# Patient Record
Sex: Female | Born: 1950 | Race: Black or African American | Hispanic: No | Marital: Married | Smoking: Former smoker
Health system: Southern US, Community
[De-identification: ages and names within clinical notes are randomized; demographics above are authoritative.]

## PROBLEM LIST (undated history)

## (undated) DIAGNOSIS — R0601 Orthopnea: Secondary | ICD-10-CM

## (undated) DIAGNOSIS — K449 Diaphragmatic hernia without obstruction or gangrene: Secondary | ICD-10-CM

## (undated) DIAGNOSIS — R0600 Dyspnea, unspecified: Secondary | ICD-10-CM

## (undated) DIAGNOSIS — I10 Essential (primary) hypertension: Secondary | ICD-10-CM

## (undated) DIAGNOSIS — J310 Chronic rhinitis: Secondary | ICD-10-CM

## (undated) DIAGNOSIS — M1990 Unspecified osteoarthritis, unspecified site: Secondary | ICD-10-CM

## (undated) DIAGNOSIS — Z87898 Personal history of other specified conditions: Secondary | ICD-10-CM

## (undated) DIAGNOSIS — J45909 Unspecified asthma, uncomplicated: Secondary | ICD-10-CM

## (undated) DIAGNOSIS — I499 Cardiac arrhythmia, unspecified: Secondary | ICD-10-CM

## (undated) DIAGNOSIS — R519 Headache, unspecified: Secondary | ICD-10-CM

## (undated) DIAGNOSIS — G4730 Sleep apnea, unspecified: Secondary | ICD-10-CM

## (undated) DIAGNOSIS — K219 Gastro-esophageal reflux disease without esophagitis: Secondary | ICD-10-CM

## (undated) DIAGNOSIS — R51 Headache: Secondary | ICD-10-CM

## (undated) DIAGNOSIS — E785 Hyperlipidemia, unspecified: Secondary | ICD-10-CM

## (undated) HISTORY — PX: UPPER GI ENDOSCOPY: SHX6162

## (undated) HISTORY — PX: OTHER SURGICAL HISTORY: SHX169

## (undated) HISTORY — DX: Headache: R51

## (undated) HISTORY — DX: Headache, unspecified: R51.9

## (undated) HISTORY — PX: CHOLECYSTECTOMY: SHX55

## (undated) HISTORY — PX: ABDOMINAL HYSTERECTOMY: SHX81

## (undated) HISTORY — PX: COLONOSCOPY: SHX174

## (undated) HISTORY — PX: BREAST CYST ASPIRATION: SHX578

## (undated) HISTORY — PX: APPENDECTOMY: SHX54

## (undated) HISTORY — DX: Chronic rhinitis: J31.0

## (undated) HISTORY — DX: Cardiac arrhythmia, unspecified: I49.9

## (undated) HISTORY — DX: Hyperlipidemia, unspecified: E78.5

---

## 2004-08-19 ENCOUNTER — Ambulatory Visit: Payer: Self-pay | Admitting: Internal Medicine

## 2005-04-01 ENCOUNTER — Other Ambulatory Visit: Payer: Self-pay

## 2005-04-07 ENCOUNTER — Inpatient Hospital Stay: Payer: Self-pay | Admitting: Orthopaedic Surgery

## 2005-08-25 ENCOUNTER — Ambulatory Visit: Payer: Self-pay | Admitting: Internal Medicine

## 2008-10-23 ENCOUNTER — Ambulatory Visit: Payer: Self-pay | Admitting: Internal Medicine

## 2012-02-23 ENCOUNTER — Ambulatory Visit: Payer: Self-pay | Admitting: Family Medicine

## 2012-10-21 ENCOUNTER — Ambulatory Visit: Payer: Self-pay | Admitting: Allergy and Immunology

## 2012-11-08 ENCOUNTER — Ambulatory Visit: Payer: Self-pay | Admitting: Unknown Physician Specialty

## 2013-05-08 ENCOUNTER — Institutional Professional Consult (permissible substitution): Payer: Self-pay | Admitting: Internal Medicine

## 2013-06-05 ENCOUNTER — Encounter: Payer: Self-pay | Admitting: Internal Medicine

## 2013-06-05 ENCOUNTER — Ambulatory Visit (INDEPENDENT_AMBULATORY_CARE_PROVIDER_SITE_OTHER): Payer: BC Managed Care – PPO | Admitting: Internal Medicine

## 2013-06-05 ENCOUNTER — Ambulatory Visit: Payer: Self-pay | Admitting: General Practice

## 2013-06-05 VITALS — BP 106/66 | HR 63 | Ht 62.0 in | Wt 211.4 lb

## 2013-06-05 DIAGNOSIS — J309 Allergic rhinitis, unspecified: Secondary | ICD-10-CM

## 2013-06-05 DIAGNOSIS — J310 Chronic rhinitis: Secondary | ICD-10-CM

## 2013-06-05 LAB — CBC
HCT: 36.6 % (ref 35.0–47.0)
MCH: 31.3 pg (ref 26.0–34.0)
MCV: 90 fL (ref 80–100)
Platelet: 202 10*3/uL (ref 150–440)
RBC: 4.06 10*6/uL (ref 3.80–5.20)
RDW: 15.2 % — ABNORMAL HIGH (ref 11.5–14.5)

## 2013-06-05 LAB — BASIC METABOLIC PANEL
Anion Gap: 3 — ABNORMAL LOW (ref 7–16)
Calcium, Total: 9.2 mg/dL (ref 8.5–10.1)
Chloride: 106 mmol/L (ref 98–107)
Creatinine: 0.71 mg/dL (ref 0.60–1.30)
EGFR (African American): >60
Potassium: 4 mmol/L (ref 3.5–5.1)
Sodium: 140 mmol/L (ref 136–145)

## 2013-06-05 LAB — URINALYSIS, COMPLETE
Bacteria: NONE SEEN
Bilirubin,UR: NEGATIVE
Leukocyte Esterase: NEGATIVE
Ph: 6 (ref 4.5–8.0)
RBC,UR: 3 /HPF (ref 0–5)
Squamous Epithelial: 1

## 2013-06-05 LAB — APTT: Activated PTT: 29.7 secs (ref 23.6–35.9)

## 2013-06-05 LAB — MRSA PCR SCREENING

## 2013-06-05 LAB — PROTIME-INR
INR: 0.9
Prothrombin Time: 12.4 secs (ref 11.5–14.7)

## 2013-06-05 MED ORDER — METHYLPREDNISOLONE ACETATE 80 MG/ML IJ SUSP
80.0000 mg | Freq: Once | INTRAMUSCULAR | Status: AC
  Administered 2013-06-05: 80 mg via INTRAMUSCULAR

## 2013-06-05 MED ORDER — BUDESONIDE 0.25 MG/2ML IN SUSP: RESPIRATORY_TRACT | Status: DC

## 2013-06-05 MED ORDER — PHENYLEPHRINE HCL 1 % NA SOLN
3.0000 [drp] | Freq: Once | NASAL | Status: AC
  Administered 2013-06-05: 3 [drp] via NASAL

## 2013-06-05 NOTE — Patient Instructions (Addendum)
  Script sent for budesonide nebulizer solution to use through your nasal nebulizer, twice daily every day for now.  Try generic Allegra/ fexofenadine- D 12 hour, once daily in the mornings to see how you tolerate it, and if it will open your nose  Neb neo nasal  Depo 80   I don't see an active problem that would interfere with planned knee replacement or anesthesia. You do need the hospital staff to follow reflux precautions, including continuing your acid blocker and elevating the head of your bed.

## 2013-06-05 NOTE — Progress Notes (Signed)
06/05/13- 62 yoF former light smoker, to re-establish with [facility] for allergies.  C/o nasal congestion, PND, and nonprod cough.  She had moved away, but now back in this area to care for her demented mother. Chronic nasal congestion has been present for 10 months continuously. Afrin and antihistamines help. Strong odors, with weather changes and respiratory irritants make it worse. CT sinuses said to be normal at [facility]. Occasional wheeze if allergies are bad, that symptoms are mostly nasal congestion and sneezing. Living in a house with no mold, no pets, no smokers. Allergy tested by Dr Barnetta Chapel positive only for dust mites. She has encasings on her bedding, air cleaner. Stopped Allegra-D because of palpitations with no further problem. Flonase used to help her nose that has not been using it recently. Has Neti pot. Daily exposure to grandchildren and their colds. Now pending left TKR .  married, living with husband, retired. Family history of allergy and heart disease.  Prior to Admission medications   Medication Sig Start Date End Date Taking? Authorizing Provider  Azelastine-Fluticasone (DYMISTA) 137-50 MCG/ACT SUSP Place into the nose.    Historical Provider, MD  budesonide (PULMICORT) 0.25 MG/2ML nebulizer solution By nebulizer twice daily 06/05/13   Waymon Budge, MD  budesonide (PULMICORT) 180 MCG/ACT inhaler Inhale 2 puffs into the lungs at bedtime.   Yes Historical Provider, MD  diclofenac (VOLTAREN) 75 MG EC tablet Take 1 tablet by mouth 2 (two) times daily.   Yes Historical Provider, MD  esomeprazole (NEXIUM) 40 MG capsule Take 40 mg by mouth as needed.   Yes Historical Provider, MD  FLUoxetine (PROZAC) 20 MG tablet Take 1 tablet by mouth daily.   Yes Historical Provider, MD  levocetirizine (XYZAL) 5 MG tablet Take 1 tablet by mouth at bedtime.   Yes Historical Provider, MD  montelukast (SINGULAIR) 10 MG tablet Take 1 tablet by mouth at bedtime.   Yes Historical Provider, MD   oxymetazoline (AFRIN) 0.05 % nasal spray Place 2 sprays into the nose 2 (two) times daily as needed for congestion.   Yes Historical Provider, MD  pravastatin (PRAVACHOL) 10 MG tablet Take 1 tablet by mouth every morning.   Yes Historical Provider, MD  rizatriptan (MAXALT) 5 MG tablet Use as directed if needed for migraines   Yes Historical Provider, MD  TOPROL XL 25 MG 24 hr tablet Take 1 tablet by mouth daily.   Yes Historical Provider, MD   Past Medical History  Diagnosis Date  . Chronic headache   . Chronic rhinitis   . Hyperlipemia   . Irregular heartbeat    Past Surgical History  Procedure Laterality Date  . Cholecystectomy    . Abdominal hysterectomy    . Right total knee replacement     Family History  Problem Relation Age of Onset  . Allergies Sister   . Allergies Daughter   . Allergies Grandchild   . Asthma Maternal Grandfather   . Asthma      maternal cousins  . Heart disease Maternal Grandmother   . Heart failure Father    History   Social History  . Marital Status: Married    Spouse Name: N/A    Number of Children: N/A  . Years of Education: N/A   Occupational History  . Retired      Programme researcher, broadcasting/film/video   Social History Main Topics  . Smoking status: Former Smoker -- 0.25 packs/day for 5 years    Types: Cigarettes    Quit date: 11/02/1970  .  Smokeless tobacco: Never Used  . Alcohol Use: No  . Drug Use: No  . Sexually Active: Not on file   Other Topics Concern  . Not on file   Social History Narrative  . No narrative on file   ROS-see HPI Constitutional:   No-   weight loss, night sweats, fevers, chills, fatigue, lassitude. HEENT:   + headaches, difficulty swallowing, tooth/dental problems, +sore throat,       No-  sneezing, itching, +ear ache, +nasal congestion, post nasal drip,  CV:  No-   chest pain, orthopnea, PND, swelling in lower extremities, anasarca,  dizziness, +palpitations Resp: +These shortness of breath with exertion or at rest.               No-   productive cough,  + non-productive cough,  No- coughing up of blood.              + change in color of mucus.  No- wheezing.   Skin: No-   rash or lesions. GI:  +heartburn, indigestion, no-abdominal pain, nausea, vomiting, diarrhea,                 change in bowel habits, loss of appetite GU: No-   dysuria, change in color of urine, no urgency or frequency.  No- flank pain. MS:  + joint pain or swelling.  No- decreased range of motion.  No- back pain. Neuro-     nothing unusual Psych:  No- change in mood or affect. No depression or anxiety.  No memory loss.  OBJ- Physical Exam General- Alert, Oriented, Affect-appropriate, Distress- none acute Skin- rash-none, lesions- none, excoriation- none Lymphadenopathy- none Head- atraumatic            Eyes- Gross vision intact, PERRLA, conjunctivae and secretions clear            Ears- Hearing, canals-normal            Nose- +turbinate edema, +sniffing, no-Septal dev, mucus, polyps, erosion, perforation             Throat- Mallampati III , mucosa clear , drainage- none, tonsils- atrophic Neck- flexible , trachea midline, no stridor , thyroid nl, carotid no bruit Chest - symmetrical excursion , unlabored           Heart/CV- RRR , no murmur , no gallop  , no rub, nl s1 s2                           - JVD- none , edema- none, stasis changes- none, varices- none           Lung- clear to P&A, wheeze- none, cough- none , dullness-none, rub- none           Chest wall-  Abd- tender-no, distended-no, bowel sounds-present, HSM- no Br/ Gen/ Rectal- Not done, not indicated Extrem- cyanosis- none, clubbing, none, atrophy- none, strength- nl Neuro- grossly intact to observation

## 2013-06-08 ENCOUNTER — Telehealth: Payer: Self-pay | Admitting: Internal Medicine

## 2013-06-08 NOTE — Telephone Encounter (Signed)
Spoke to pt. She wanted to double check that she should be using Pulmicort through her nasal neb machine. Per CY OV note from today - pt is to use Pulmicort in her nasal neb machine.  Pt is aware. Nothing further was needed.

## 2013-06-12 DIAGNOSIS — J310 Chronic rhinitis: Secondary | ICD-10-CM | POA: Insufficient documentation

## 2013-06-12 NOTE — Assessment & Plan Note (Signed)
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  She has a device given to her for nasal aerosol, so she will try nasal administration of budesonide nebulizer solution. Also tried toleration of an occasional Allegra-D 12 hour as long as she doesn't take it continuously. Today given nasal nebulization with decongestant, Depo-Medrol

## 2013-06-19 ENCOUNTER — Inpatient Hospital Stay: Payer: Self-pay | Admitting: General Practice

## 2013-06-20 LAB — BASIC METABOLIC PANEL
Anion Gap: 2 — ABNORMAL LOW (ref 7–16)
BUN: 10 mg/dL (ref 7–18)
EGFR (African American): >60
EGFR (Non-African Amer.): >60
Glucose: 110 mg/dL — ABNORMAL HIGH (ref 65–99)
Osmolality: 272 (ref 275–301)
Sodium: 136 mmol/L (ref 136–145)

## 2013-06-20 LAB — HEMOGLOBIN: HGB: 11.9 g/dL — ABNORMAL LOW (ref 12.0–16.0)

## 2013-06-20 LAB — PLATELET COUNT: Platelet: 199 10*3/uL (ref 150–440)

## 2013-06-21 LAB — BASIC METABOLIC PANEL
Anion Gap: 4 — ABNORMAL LOW (ref 7–16)
BUN: 5 mg/dL — ABNORMAL LOW (ref 7–18)
Co2: 31 mmol/L (ref 21–32)
EGFR (African American): >60
EGFR (Non-African Amer.): >60
Glucose: 75 mg/dL (ref 65–99)
Osmolality: 268 (ref 275–301)
Potassium: 3.8 mmol/L (ref 3.5–5.1)
Sodium: 136 mmol/L (ref 136–145)

## 2013-07-31 ENCOUNTER — Ambulatory Visit: Payer: BC Managed Care – PPO | Admitting: Internal Medicine

## 2013-08-04 ENCOUNTER — Telehealth: Payer: Self-pay | Admitting: Internal Medicine

## 2013-08-04 MED ORDER — AZELASTINE-FLUTICASONE 137-50 MCG/ACT NA SUSP: 1.0000 | Freq: Every day | NASAL | Status: AC

## 2013-08-04 NOTE — Telephone Encounter (Signed)
Called and spoke with pt and she is aware of CY recs to try the dymista.  Pt will have her husband come by and pick this up.

## 2013-08-04 NOTE — Telephone Encounter (Signed)
Per CY-offer sample of Dymista 1-2 sprays in each nostril QHS (might need twice daily).

## 2013-08-04 NOTE — Telephone Encounter (Signed)
Called and spoke with pt .  She stated that for the last 5 days she has had dry cough with thick clear mucus draining in her throat.  Runny nose, itchy eyes.  She has been doing her nasal wash, allergy otc meds and for the last 3 days she has been using allegra d but this has not helped at all.  Pt is requesting recs.  CY please advise. Thanks  Allergies  Allergen Reactions  . Sulfa Antibiotics     Red rash and itching   Last ov--06/05/13 No pending appt.

## 2014-01-30 ENCOUNTER — Ambulatory Visit: Payer: Self-pay | Admitting: Family Medicine

## 2014-02-09 ENCOUNTER — Ambulatory Visit: Payer: Self-pay | Admitting: Family Medicine

## 2015-02-22 NOTE — Discharge Summary (Signed)
PATIENT NAME:  Erin Bond, Erin Bond MR#:  [id number] DATE OF BIRTH:  [birth year]  DATE OF ADMISSION:  06/19/2013 DATE OF DISCHARGE:  06/22/2013  ADMITTING DIAGNOSIS: Degenerative arthrosis of the left knee.   DISCHARGE DIAGNOSIS: Degenerative arthrosis of the left knee.   HISTORY: The patient is a pleasant 64 year old female who has been followed at [facility] for progression of left knee pain. She had reported a several year history of increase in discomfort to the knee. Her pain was noted be aggravated with weight-bearing activities. Most of the pain was localized to the medial aspect of the knee. She was having significant difficulty with ambulation as well as stair ambulation due to the severity of pain. On occasion the patient states that she had gone to using a cane for ambulation. She had not seen any significant improvement in her condition despite nonsteroidal anti-inflammatory medications, intra-articular cortisone injections as well as activity modification. The patient states that the pain had increased to the point that it was significantly interfering with her activities of daily living. X-rays taken in [facility] showed narrowing of the medial cartilage space with associated varus alignment. Subchondral sclerosis was noted along with osteophyte formation. After discussion of the risks and benefits of surgical intervention, the patient expressed her understanding of the risks and benefits and agreed with plans for surgical intervention.   PROCEDURE: Left total knee arthroplasty using computer-assisted navigation.   ANESTHESIA: Spinal.   SOFT TISSUE RELEASE: Anterior cruciate ligament, posterior cruciate ligament, deep medial collateral ligament, as well as the patellofemoral ligament.   IMPLANTS UTILIZED: DePuy PFC Sigma size 3 posterior stabilized femoral component (cemented), size 2.5 MBT tibial component (cemented), 32 mm three-pegged oval dome patella (cemented), and  a 10 mm stabilized rotating platform polyethylene insert.   HOSPITAL COURSE: The patient tolerated the procedure very well. She had no complications. She was then taken to PAC-U where she was stabilized and then transferred to the orthopedic floor. The patient began receiving anticoagulation therapy of Lovenox 30 mg subcu q. 12 hours per anesthesia and pharmacy protocol. She was fitted with TED stockings bilaterally. These were allowed to be removed 1 hour per 8 hour shift. The left one was applied on day 2 following removal of the Hemovac and dressing change. The patient was also fitted with AVI compression foot pumps bilaterally set at 80 mmHg. Her calves have been nontender. There has been no evidence of any DVTs. Negative Homans sign. Heels were elevated off the bed using rolled towels.   The patient has denied any chest pain or shortness of breath. Vital signs have been stable. She has been afebrile. Hemodynamically she was stable and no transfusions were given other than the Autovac transfusion given the first 6 hours. The patient did have a drop in O2 sats on day 1. This was felt to be secondary to her anxiety and some pain issues. Her O2 was increased and she was encouraged to use an incentive spirometer. Subsequently, her O2 sats improved to the mid 90s on room air. She has been asymptomatic during this time frame.   Physical therapy was initiated on day 1 for gait training and transfers. Upon being discharged, she was ambulating 125 feet. She has been a little slow to progress due to anxiety issues. Occupational therapy was also initiated on day 1 for ADL and assistive devices.   The patient's IV, Foley and Hemovac were DC'd on day 2 along with a dressing change. The wound was free of any  drainage or signs of infection. Polar Care was reapplied to the surgical leg maintaining a temperature of 40 to 50 degrees Fahrenheit.   DISPOSITION: The patient is being discharged to home in improved stable  condition.   DISCHARGE INSTRUCTIONS: She may weight bear as tolerated. Continue using a walker until cleared by physical therapy to go to a quad cane. Physical therapy for gait trainging and occupational therapy for ADL's. Elevate the heels off the bed using rolled towels. Continue TED stockings. These are allowed to be removed 1 hour per 8 hour shift. Incentive spirometer q. 1 hour while awake. Encourage cough and deep breathing q. 2 hours while awake. She is placed on a regular diet. Continue Polar Care to the surgical leg maintaining a temperature of 40 to 50 degrees Fahrenheit. Keep the wound clean and dry. Change dressing as needed. [facility] at 8:45.   DRUG ALLERGIES: SULFA DRUGS.   DISCHARGE MEDICATIONS: 1.  Senokot-S 1 tablet b.i.d.  2.  Nexium 40 mg b.i.d.  3.  Fluoxetine 20 mg q. a.m.  4.  Metoprolol ER 25 mg q. a.m.  5.  Montelukast 10 mg at bedtime. 6.  Pravachol 10 mg daily. 7.  Lovenox 40 mg subcu q. day for 14 days and then DC and begin taking one 81 mg enteric-coated aspirin.  8.  Budesonide 0.5 mg/2 mg SVN respiratory therapy protocol q. 12 hours p.r.n. 9.  Fluconazole FHA inhaler 1 puff b.i.d. 10.  Tylenol ES 500 to 1000 mg q. 4 hours p.r.n. for pain and temperatures of 100.4. 11.  Mylanta DS 30 mL q. 6 hours p.r.n. 12.  Dulcolax suppositories 10 mg rectally daily p.r.n. if no relief with milk of magnesia. 13.  Milk of magnesia 30 mL b.i.d. p.r.n.  14.  Roxicodone 5 to 10 mg q. 4 to 6 hours p.r.n. for pain. 15.  Maxalt 5 mg q. 2 hours p.r.n. for headaches.  16.  Tramadol 50 to 100 mg q. 4 to 6 hours p.r.n. for pain.  17.  Enema soapsuds if no results with milk of magnesia or Dulcolax.   PAST MEDICAL HISTORY: 1.  Anemia. 2.  Arthritis.  3.  Chickenpox.  4.  Gastroesophageal reflux disease. 5.  Hernias. 6.  TMJ.  7.  Carpal tunnel syndrome bilateral wrists.  8.  Hyperlipidemia.  9.  Anxiety.   ____________________________ Van ClinesJon Kora Groom, PA jrw:sb D: 06/22/2013 07:54:44 ET T: 06/22/2013 08:24:25 ET JOB#: [id number]  cc: Van ClinesJon Kathe Wirick, PA, <Dictator> Jerrett Baldinger PA ELECTRONICALLY SIGNED 06/22/2013 21:27

## 2015-02-22 NOTE — Op Note (Signed)
PATIENT NAME:  Erin Bond, [name] MR#:  [id number] DATE OF BIRTH:  [birth year]  DATE OF PROCEDURE:  06/19/2013  PREOPERATIVE DIAGNOSIS: Degenerative arthrosis of the left knee.   POSTOPERATIVE DIAGNOSIS: Degenerative arthrosis of the left knee.   PROCEDURE PERFORMED: Left total knee arthroplasty using computer-assisted navigation.   SURGEON: Bennie PieriniJames P. James Hooten, M.D.   ASSISTANT:  Van ClinesJon Wolfe, PA (required to maintain retraction throughout the procedure)   ANESTHESIA: Spinal.   ESTIMATED BLOOD LOSS: 100 mL.   FLUIDS REPLACED: 1800 mL of crystalloid.   TOURNIQUET TIME: 85 minutes.   DRAINS: Two medium drains to reinfusion system.   SOFT TISSUE RELEASES: Anterior cruciate ligament, posterior cruciate ligament, deep medial patella ligament, and patellofemoral ligament.   IMPLANTS UTILIZED: DePuy PFC Sigma size 3 posterior stabilized femoral component (cemented), size 2.5 MBT tibial component (cemented), 32 mm three-peg oval dome patella (cemented), and a 10 mm stabilized rotating platform polyethylene insert.   INDICATIONS FOR SURGERY: The patient is a 64 year old female who has been seen for complaints of progressive left knee pain. X-rays demonstrated severe degenerative changes in tricompartmental fashion with relative varus deformity. After discussion of the risks and benefits of surgical intervention, the patient expressed understanding of the risks, benefits and agreed with plans for surgical intervention.   PROCEDURE IN DETAIL: The patient was brought to the operating room and, after adequate spinal anesthesia was achieved, a tourniquet was placed on the patient's upper left thigh. The patient's left knee and leg were cleaned and prepped with alcohol and DuraPrep and draped in the usual sterile fashion. A "timeout" was performed as per usual protocol. The left lower extremity was exsanguinated using an Esmarch and tourniquet was inflated to 300 mmHg. An anterior longitudinal incision was  made followed by a standard mid vastus approach. A small effusion was evacuated. The deep fibers of the medial collateral ligament were elevated in a subperiosteal fashion off the medial flare of the tibia, so as to maintain a continuous soft tissue sleeve. The patella was subluxed laterally and the patellofemoral ligament was incised. Inspection of the knee demonstrated severe degenerative changes in tricompartmental fashion with full-thickness loss of articular cartilage noted to the medial and lateral compartments. Osteophytes were debrided using a rongeur. Anterior and posterior cruciate ligaments were excised. Two 4.0 mm Schanz pins were inserted and the femur and into the tibia for attachment of the array of trackers used for computer-assisted navigation. Hip center was identified using circumduction technique. Distal landmarks were mapped using the computer. The distal femur and proximal tibia were mapped using a computer. Distal femoral cutting guide was positioned using computer-assisted navigation, so as to achieve a 5 degrees distal valgus cut. Cut was performed and verified using the computer. Distal femur was sized and it was felt that a size 3 femoral component was appropriate. A size 3 cutting guide was positioned and the anterior cut was performed and verified using the computer. This was followed by completion of the posterior and chamfer cuts. Femoral cutting guide for the central box was then positioned and the central box cut was performed. Attention then directed to the proximal tibia. Medial and lateral menisci were excised. The extramedullary tibial cutting guide was positioned using computer-assisted navigation so as to achieve a 0 degree varus valgus alignment and 0 degrees posterior slope. Cut was performed and verified using the computer. The proximal tibia was sized and it was felt that a size 2.5 tibial tray was appropriate. Tibial and femoral trials were inserted followed  by insertion  of a 10 mm polyethylene insert. Excellent mediolateral soft tissue balancing was appreciated both in full extension and in flexion. Finally, the patella was cut and prepared so as to accommodate a 32 mm three-peg oval dome patella. Patellar trial was placed and the knee was placed through a range of motion. Excellent patellar tracking was appreciated. The femoral trial was removed. Central post hole for the tibial component was reamed followed by insertion of a keel punch. Tibial trials were then removed. The cut surfaces of bone were irrigated with copious amounts of normal saline with antibiotic solution using pulsatile lavage and then suctioned dry. Polymethyl methacrylate cement with gentamicin was prepared in the usual fashion using a vacuum mixer. Cement was applied to the cut surface of the proximal tibia as well as along the undersurface of a size 2.5 MBT tibial component. The tibial component was positioned and impacted into place. Excess cement was removed using freer elevators. Cement was then applied to the cut surface of the femur as well as along the posterior flanges of a size 3 posterior stabilized femoral component. Femoral component was positioned and impacted into place. Excess cement was removed using freer elevators. A 10 mm polyethylene trial was inserted and the knee was brought in full extension with steady axial compression applied. Finally, cement was applied to the backside of a 32 mm three-peg oval dome patella and the patellar component was positioned and patellar clamp applied. Excess cement was removed freer elevators.   After adequate curing of cement, the tourniquet was deflated after total tourniquet time of 85 minutes. Hemostasis was achieved using electrocautery. The knee was irrigated with copious amounts of normal saline with antibiotic solution using pulsatile lavage and then suctioned dry. The knee was inspected for any residual cement debris. 20 mL of 1.3% Exparel in 40 mL  of normal saline was injected along the posterior capsule, medial and lateral recesses, and along the arthrotomy site. A 10 mm stabilized rotating platform polyethylene insert was inserted and the knee was placed through a range of motion with excellent patellar tracking appreciated and excellent mediolateral soft tissue balancing noted. Two medium drains were placed in the wound bed and brought out through a separate stab incision to be attached to a reinfusion system. The medial parapatellar portion of the incision was reapproximated using interrupted sutures of #1 Vicryl. The subcutaneous tissue was approximated in layers using first #0 Vicryl followed by 2-0 Vicryl. Skin was closed with skin staples. A sterile dressing was applied. The patient tolerated the procedure well. She was transported to the recovery room in stable condition.    ____________________________ Illene Labrador. Angie Fava., MD jph:cc D: 06/19/2013 15:16:12 ET T: 06/19/2013 16:18:12 ET JOB#: [id number]  cc: Fayrene Fearing P. Angie Fava., MD, <Dictator> JAMES P Angie Fava MD ELECTRONICALLY SIGNED 07/12/2013 7:10

## 2015-06-07 ENCOUNTER — Other Ambulatory Visit: Payer: Self-pay | Admitting: Family Medicine

## 2015-06-07 DIAGNOSIS — Z1231 Encounter for screening mammogram for malignant neoplasm of breast: Secondary | ICD-10-CM

## 2015-06-13 ENCOUNTER — Ambulatory Visit
Admission: RE | Admit: 2015-06-13 | Discharge: 2015-06-13 | Disposition: A | Discharge status: Home / Self Care | Payer: Medicare PPO | Source: Ambulatory Visit | Attending: Family Medicine | Admitting: Family Medicine

## 2015-06-13 ENCOUNTER — Other Ambulatory Visit: Payer: Self-pay | Admitting: Family Medicine

## 2015-06-13 ENCOUNTER — Ambulatory Visit: Payer: BC Managed Care – PPO

## 2015-06-13 DIAGNOSIS — Z1231 Encounter for screening mammogram for malignant neoplasm of breast: Secondary | ICD-10-CM | POA: Insufficient documentation

## 2016-06-18 ENCOUNTER — Other Ambulatory Visit: Payer: Self-pay | Admitting: Family Medicine

## 2016-06-18 DIAGNOSIS — Z1231 Encounter for screening mammogram for malignant neoplasm of breast: Secondary | ICD-10-CM

## 2016-07-08 ENCOUNTER — Ambulatory Visit
Admission: RE | Admit: 2016-07-08 | Discharge: 2016-07-08 | Disposition: A | Discharge status: Home / Self Care | Payer: Medicare Other | Source: Ambulatory Visit | Attending: Family Medicine | Admitting: Family Medicine

## 2016-07-08 ENCOUNTER — Other Ambulatory Visit: Payer: Self-pay | Admitting: Family Medicine

## 2016-07-08 DIAGNOSIS — Z1231 Encounter for screening mammogram for malignant neoplasm of breast: Secondary | ICD-10-CM

## 2017-08-13 ENCOUNTER — Other Ambulatory Visit: Payer: Self-pay | Admitting: Family Medicine

## 2017-08-13 DIAGNOSIS — Z1231 Encounter for screening mammogram for malignant neoplasm of breast: Secondary | ICD-10-CM

## 2017-09-20 ENCOUNTER — Ambulatory Visit
Admission: RE | Admit: 2017-09-20 | Discharge: 2017-09-20 | Disposition: A | Discharge status: Home / Self Care | Payer: Medicare Other | Source: Ambulatory Visit | Attending: Family Medicine | Admitting: Family Medicine

## 2017-09-20 DIAGNOSIS — Z1231 Encounter for screening mammogram for malignant neoplasm of breast: Secondary | ICD-10-CM | POA: Insufficient documentation

## 2018-06-07 ENCOUNTER — Other Ambulatory Visit: Payer: Self-pay | Admitting: Sleep Medicine

## 2018-06-07 ENCOUNTER — Other Ambulatory Visit: Payer: Self-pay | Admitting: Family Medicine

## 2018-06-07 DIAGNOSIS — Z1231 Encounter for screening mammogram for malignant neoplasm of breast: Secondary | ICD-10-CM

## 2018-09-26 ENCOUNTER — Ambulatory Visit
Admission: RE | Admit: 2018-09-26 | Discharge: 2018-09-26 | Disposition: A | Discharge status: Home / Self Care | Payer: Medicare Other | Source: Ambulatory Visit | Attending: Family Medicine | Admitting: Family Medicine

## 2018-09-26 DIAGNOSIS — Z1231 Encounter for screening mammogram for malignant neoplasm of breast: Secondary | ICD-10-CM | POA: Diagnosis not present

## 2019-02-07 ENCOUNTER — Ambulatory Visit: Admission: RE | Admit: 2019-02-07 | Payer: Medicare Other | Source: Ambulatory Visit | Admitting: Ophthalmology

## 2019-02-07 ENCOUNTER — Encounter: Admission: RE | Payer: Self-pay | Source: Ambulatory Visit

## 2019-02-07 SURGERY — PHACOEMULSIFICATION, CATARACT, WITH IOL INSERTION
Anesthesia: Choice | Laterality: Right

## 2019-09-05 ENCOUNTER — Other Ambulatory Visit: Payer: Self-pay | Admitting: Family Medicine

## 2019-09-05 DIAGNOSIS — Z1231 Encounter for screening mammogram for malignant neoplasm of breast: Secondary | ICD-10-CM

## 2019-10-02 ENCOUNTER — Ambulatory Visit
Admission: RE | Admit: 2019-10-02 | Discharge: 2019-10-02 | Disposition: A | Discharge status: Home / Self Care | Payer: Medicare Other | Source: Ambulatory Visit | Attending: Family Medicine | Admitting: Family Medicine

## 2019-10-02 DIAGNOSIS — Z1231 Encounter for screening mammogram for malignant neoplasm of breast: Secondary | ICD-10-CM

## 2019-10-10 ENCOUNTER — Encounter: Payer: Self-pay | Admitting: Anesthesiology

## 2019-10-13 ENCOUNTER — Other Ambulatory Visit: Payer: Self-pay

## 2019-10-13 ENCOUNTER — Other Ambulatory Visit
Admission: RE | Admit: 2019-10-13 | Discharge: 2019-10-13 | Disposition: A | Discharge status: Home / Self Care | Payer: Medicare Other | Source: Ambulatory Visit | Attending: Ophthalmology | Admitting: Ophthalmology

## 2019-10-13 DIAGNOSIS — Z01812 Encounter for preprocedural laboratory examination: Secondary | ICD-10-CM | POA: Diagnosis present

## 2019-10-13 DIAGNOSIS — U071 COVID-19: Secondary | ICD-10-CM | POA: Diagnosis not present

## 2019-10-13 HISTORY — DX: COVID-19: U07.1

## 2019-10-14 LAB — SARS CORONAVIRUS 2 (TAT 6-24 HRS): SARS Coronavirus 2: POSITIVE — AB

## 2019-10-17 ENCOUNTER — Ambulatory Visit: Admission: RE | Admit: 2019-10-17 | Payer: Medicare Other | Source: Ambulatory Visit | Admitting: Ophthalmology

## 2019-10-17 SURGERY — PHACOEMULSIFICATION, CATARACT, WITH IOL INSERTION
Anesthesia: Topical | Laterality: Right

## 2019-10-25 ENCOUNTER — Other Ambulatory Visit: Payer: Self-pay

## 2019-10-25 ENCOUNTER — Encounter: Payer: Self-pay | Admitting: Ophthalmology

## 2019-11-01 NOTE — Discharge Instructions (Signed)

## 2019-11-02 ENCOUNTER — Other Ambulatory Visit: Payer: Medicare Other

## 2019-11-07 ENCOUNTER — Encounter: Payer: Self-pay | Admitting: Ophthalmology

## 2019-11-07 ENCOUNTER — Other Ambulatory Visit: Payer: Self-pay

## 2019-11-07 ENCOUNTER — Ambulatory Visit: Payer: Medicare PPO | Admitting: Anesthesiology

## 2019-11-07 ENCOUNTER — Ambulatory Visit
Admission: RE | Admit: 2019-11-07 | Discharge: 2019-11-07 | Disposition: A | Discharge status: Home / Self Care | Payer: Medicare PPO | Source: Ambulatory Visit | Attending: Ophthalmology | Admitting: Ophthalmology

## 2019-11-07 ENCOUNTER — Encounter
Admission: RE | Disposition: A | Discharge status: Home / Self Care | Payer: Self-pay | Source: Ambulatory Visit | Attending: Ophthalmology

## 2019-11-07 DIAGNOSIS — J45909 Unspecified asthma, uncomplicated: Secondary | ICD-10-CM | POA: Diagnosis not present

## 2019-11-07 DIAGNOSIS — I10 Essential (primary) hypertension: Secondary | ICD-10-CM | POA: Insufficient documentation

## 2019-11-07 DIAGNOSIS — Z87891 Personal history of nicotine dependence: Secondary | ICD-10-CM | POA: Insufficient documentation

## 2019-11-07 DIAGNOSIS — E7800 Pure hypercholesterolemia, unspecified: Secondary | ICD-10-CM | POA: Insufficient documentation

## 2019-11-07 DIAGNOSIS — G4730 Sleep apnea, unspecified: Secondary | ICD-10-CM | POA: Diagnosis not present

## 2019-11-07 DIAGNOSIS — Z96653 Presence of artificial knee joint, bilateral: Secondary | ICD-10-CM | POA: Diagnosis not present

## 2019-11-07 DIAGNOSIS — Z7951 Long term (current) use of inhaled steroids: Secondary | ICD-10-CM | POA: Insufficient documentation

## 2019-11-07 DIAGNOSIS — M1990 Unspecified osteoarthritis, unspecified site: Secondary | ICD-10-CM | POA: Insufficient documentation

## 2019-11-07 DIAGNOSIS — H2511 Age-related nuclear cataract, right eye: Secondary | ICD-10-CM | POA: Diagnosis present

## 2019-11-07 DIAGNOSIS — Z6841 Body Mass Index (BMI) 40.0 and over, adult: Secondary | ICD-10-CM | POA: Diagnosis not present

## 2019-11-07 DIAGNOSIS — Z79899 Other long term (current) drug therapy: Secondary | ICD-10-CM | POA: Diagnosis not present

## 2019-11-07 HISTORY — DX: Unspecified asthma, uncomplicated: J45.909

## 2019-11-07 HISTORY — DX: Sleep apnea, unspecified: G47.30

## 2019-11-07 HISTORY — DX: Gastro-esophageal reflux disease without esophagitis: K21.9

## 2019-11-07 HISTORY — DX: Essential (primary) hypertension: I10

## 2019-11-07 HISTORY — DX: Personal history of other specified conditions: Z87.898

## 2019-11-07 HISTORY — PX: CATARACT EXTRACTION W/PHACO: SHX586

## 2019-11-07 HISTORY — DX: Unspecified osteoarthritis, unspecified site: M19.90

## 2019-11-07 HISTORY — DX: Dyspnea, unspecified: R06.00

## 2019-11-07 HISTORY — DX: Orthopnea: R06.01

## 2019-11-07 HISTORY — DX: Diaphragmatic hernia without obstruction or gangrene: K44.9

## 2019-11-07 SURGERY — PHACOEMULSIFICATION, CATARACT, WITH IOL INSERTION
Anesthesia: Monitor Anesthesia Care | Laterality: Right

## 2019-11-07 MED ORDER — EPINEPHRINE PF 1 MG/ML IJ SOLN
INTRAOCULAR | Status: DC | PRN
  Administered 2019-11-07: 48 mL via OPHTHALMIC

## 2019-11-07 MED ORDER — LIDOCAINE HCL (PF) 2 % IJ SOLN
INTRAOCULAR | Status: DC | PRN
  Administered 2019-11-07: 2 mL

## 2019-11-07 MED ORDER — FENTANYL CITRATE (PF) 100 MCG/2ML IJ SOLN
INTRAMUSCULAR | Status: DC | PRN
  Administered 2019-11-07 (×2): 50 ug via INTRAVENOUS

## 2019-11-07 MED ORDER — ACETAMINOPHEN 325 MG PO TABS: 325.0000 mg | ORAL_TABLET | Freq: Once | ORAL | Status: DC

## 2019-11-07 MED ORDER — ONDANSETRON HCL 4 MG/2ML IJ SOLN
INTRAMUSCULAR | Status: DC | PRN
  Administered 2019-11-07: 4 mg via INTRAVENOUS

## 2019-11-07 MED ORDER — TETRACAINE HCL 0.5 % OP SOLN
1.0000 [drp] | OPHTHALMIC | Status: DC | PRN
  Administered 2019-11-07 (×3): 1 [drp] via OPHTHALMIC

## 2019-11-07 MED ORDER — ACETAMINOPHEN 160 MG/5ML PO SOLN: 325.0000 mg | Freq: Once | ORAL | Status: DC

## 2019-11-07 MED ORDER — MIDAZOLAM HCL 2 MG/2ML IJ SOLN
INTRAMUSCULAR | Status: DC | PRN
  Administered 2019-11-07: 2 mg via INTRAVENOUS

## 2019-11-07 MED ORDER — BRIMONIDINE TARTRATE-TIMOLOL 0.2-0.5 % OP SOLN
OPHTHALMIC | Status: DC | PRN
  Administered 2019-11-07: 1 [drp] via OPHTHALMIC

## 2019-11-07 MED ORDER — ARMC OPHTHALMIC DILATING DROPS
1.0000 "application " | OPHTHALMIC | Status: DC | PRN
  Administered 2019-11-07 (×3): 1 via OPHTHALMIC

## 2019-11-07 MED ORDER — MOXIFLOXACIN HCL 0.5 % OP SOLN
OPHTHALMIC | Status: DC | PRN
  Administered 2019-11-07: 0.2 mL via OPHTHALMIC

## 2019-11-07 MED ORDER — NA CHONDROIT SULF-NA HYALURON 40-17 MG/ML IO SOLN
INTRAOCULAR | Status: DC | PRN
  Administered 2019-11-07: 1 mL via INTRAOCULAR

## 2019-11-07 SURGICAL SUPPLY — 20 items
CANNULA ANT/CHMB 27G (MISCELLANEOUS) ×2 IMPLANT
CANNULA ANT/CHMB 27GA (MISCELLANEOUS) ×6 IMPLANT
GLOVE SURG LX 8.0 MICRO (GLOVE) ×2
GLOVE SURG LX STRL 8.0 MICRO (GLOVE) ×1 IMPLANT
GLOVE SURG TRIUMPH 8.0 PF LTX (GLOVE) ×3 IMPLANT
GOWN STRL REUS W/ TWL LRG LVL3 (GOWN DISPOSABLE) ×2 IMPLANT
GOWN STRL REUS W/TWL LRG LVL3 (GOWN DISPOSABLE) ×4
LENS IOL TECNIS ITEC 20.0 (Intraocular Lens) ×2 IMPLANT
MARKER SKIN DUAL TIP RULER LAB (MISCELLANEOUS) ×3 IMPLANT
NDL FILTER BLUNT 18X1 1/2 (NEEDLE) ×1 IMPLANT
NDL RETROBULBAR .5 NSTRL (NEEDLE) ×3 IMPLANT
NEEDLE FILTER BLUNT 18X 1/2SAF (NEEDLE) ×2
NEEDLE FILTER BLUNT 18X1 1/2 (NEEDLE) ×1 IMPLANT
PACK EYE AFTER SURG (MISCELLANEOUS) ×3 IMPLANT
PACK OPTHALMIC (MISCELLANEOUS) ×3 IMPLANT
PACK PORFILIO (MISCELLANEOUS) ×3 IMPLANT
SYR 3ML LL SCALE MARK (SYRINGE) ×3 IMPLANT
SYR TB 1ML LUER SLIP (SYRINGE) ×3 IMPLANT
WATER STERILE IRR 250ML POUR (IV SOLUTION) ×3 IMPLANT
WIPE NON LINTING 3.25X3.25 (MISCELLANEOUS) ×3 IMPLANT

## 2019-11-07 NOTE — Op Note (Signed)
PREOPERATIVE DIAGNOSIS:  Nuclear sclerotic cataract of the right eye.   POSTOPERATIVE DIAGNOSIS:  H25.11 Cataract   OPERATIVE PROCEDURE:@   SURGEON:  Galen Manila, MD.   ANESTHESIA:  Anesthesiologist: Ranee Gosselin, MD CRNA: Omer Jack, CRNA  1.      Managed anesthesia care. 2.      0.52ml of Shugarcaine was instilled in the eye following the paracentesis.   COMPLICATIONS:  None.   TECHNIQUE:   Stop and chop   DESCRIPTION OF PROCEDURE:  The patient was examined and consented in the preoperative holding area where the aforementioned topical anesthesia was applied to the right eye and then brought back to the Operating Room where the right eye was prepped and draped in the usual sterile ophthalmic fashion and a lid speculum was placed. A paracentesis was created with the side port blade and the anterior chamber was filled with viscoelastic. A near clear corneal incision was performed with the steel keratome. A continuous curvilinear capsulorrhexis was performed with a cystotome followed by the capsulorrhexis forceps. Hydrodissection and hydrodelineation were carried out with BSS on a blunt cannula. The lens was removed in a stop and chop  technique and the remaining cortical material was removed with the irrigation-aspiration handpiece. The capsular bag was inflated with viscoelastic and the Technis [id number]  lens was placed in the capsular bag without complication. The remaining viscoelastic was removed from the eye with the irrigation-aspiration handpiece. The wounds were hydrated. The anterior chamber was flushed with BSS and the eye was inflated to physiologic pressure. 0.16ml of Vigamox was placed in the anterior chamber. The wounds were found to be water tight. The eye was dressed with Combigan. The patient was given protective glasses to wear throughout the day and a shield with which to sleep tonight. The patient was also given drops with which to begin a drop regimen today and will  follow-up with me in one day. Implant Name Type Inv. Item Serial No. Manufacturer Lot No. LRB No. Used Action  LENS IOL DIOP 20.0 - [id number] Intraocular Lens LENS IOL DIOP 20.0 [id number] AMO  Right 1 Implanted   Procedure(s) with comments: CATARACT EXTRACTION PHACO AND INTRAOCULAR LENS PLACEMENT (IOC) RIGHT; 5.45, 00:36.1 (Right) - Sleep apnea-no CPAP Covid positive 10-13-2019 no test 90 days  Electronically signed: Galen Manila 11/07/2019 11:33 AM

## 2019-11-07 NOTE — Anesthesia Postprocedure Evaluation (Signed)
Anesthesia Post Note  Patient: Erin Bond  Procedure(s) Performed: CATARACT EXTRACTION PHACO AND INTRAOCULAR LENS PLACEMENT (IOC) RIGHT; 5.45, 00:36.1 (Right )     Patient location during evaluation: PACU Anesthesia Type: MAC Level of consciousness: awake and alert and oriented Pain management: satisfactory to patient Vital Signs Assessment: post-procedure vital signs reviewed and stable Respiratory status: spontaneous breathing, nonlabored ventilation and respiratory function stable Cardiovascular status: blood pressure returned to baseline and stable Postop Assessment: Adequate PO intake and No signs of nausea or vomiting Anesthetic complications: no    Raliegh Ip

## 2019-11-07 NOTE — Anesthesia Procedure Notes (Signed)
Procedure Name: MAC Performed by: Nefi Musich M, CRNA Pre-anesthesia Checklist: Patient identified, Timeout performed, Patient being monitored, Suction available and Emergency Drugs available Patient Re-evaluated:Patient Re-evaluated prior to induction Oxygen Delivery Method: Nasal cannula       

## 2019-11-07 NOTE — Transfer of Care (Signed)
Immediate Anesthesia Transfer of Care Note  Patient: Erin Bond  Procedure(s) Performed: CATARACT EXTRACTION PHACO AND INTRAOCULAR LENS PLACEMENT (IOC) RIGHT; 5.45, 00:36.1 (Right )  Patient Location: PACU  Anesthesia Type: MAC  Level of Consciousness: awake, alert  and patient cooperative  Airway and Oxygen Therapy: Patient Spontanous Breathing and Patient connected to supplemental oxygen  Post-op Assessment: Post-op Vital signs reviewed, Patient's Cardiovascular Status Stable, Respiratory Function Stable, Patent Airway and No signs of Nausea or vomiting  Post-op Vital Signs: Reviewed and stable  Complications: No apparent anesthesia complications

## 2019-11-07 NOTE — H&P (Signed)
All labs reviewed. Abnormal studies sent to patients PCP when indicated.  Previous H&P reviewed, patient examined, there are NO CHANGES.  Erin Bond Porfilio1/5/202111:05 AM

## 2019-11-07 NOTE — Anesthesia Preprocedure Evaluation (Signed)
Anesthesia Evaluation  Patient identified by MRN, date of birth, ID band Patient awake    Reviewed: Allergy & Precautions, H&P , NPO status , Patient's Chart, lab work & pertinent test results  Airway Mallampati: II  TM Distance: >3 FB Neck ROM: full    Dental no notable dental hx.    Pulmonary asthma , sleep apnea , former smoker,    Pulmonary exam normal breath sounds clear to auscultation       Cardiovascular hypertension, Normal cardiovascular exam Rhythm:regular Rate:Normal     Neuro/Psych    GI/Hepatic   Endo/Other  Morbid obesity  Renal/GU      Musculoskeletal   Abdominal   Peds  Hematology   Anesthesia Other Findings   Reproductive/Obstetrics                             Anesthesia Physical Anesthesia Plan  ASA: III  Anesthesia Plan: MAC   Post-op Pain Management:    Induction:   PONV Risk Score and Plan: 2 and Midazolam, TIVA and Treatment may vary due to age or medical condition  Airway Management Planned:   Additional Equipment:   Intra-op Plan:   Post-operative Plan:   Informed Consent: I have reviewed the patients History and Physical, chart, labs and discussed the procedure including the risks, benefits and alternatives for the proposed anesthesia with the patient or authorized representative who has indicated his/her understanding and acceptance.       Plan Discussed with: CRNA  Anesthesia Plan Comments:         Anesthesia Quick Evaluation

## 2019-12-26 ENCOUNTER — Other Ambulatory Visit: Payer: Self-pay

## 2019-12-26 ENCOUNTER — Encounter: Payer: Self-pay | Admitting: Ophthalmology

## 2019-12-29 NOTE — Discharge Instructions (Signed)
General Anesthesia, Adult, Care After This sheet gives you information about how to care for yourself after your procedure. Your health care provider may also give you more specific instructions. If you have problems or questions, contact your health care provider. What can I expect after the procedure? After the procedure, the following side effects are common:  Pain or discomfort at the IV site.  Nausea.  Vomiting.  Sore throat.  Trouble concentrating.  Feeling cold or chills.  Weak or tired.  Sleepiness and fatigue.  Soreness and body aches. These side effects can affect parts of the body that were not involved in surgery. Follow these instructions at home:  For at least 24 hours after the procedure:  Have a responsible adult stay with you. It is important to have someone help care for you until you are awake and alert.  Rest as needed.  Do not: ? Participate in activities in which you could fall or become injured. ? Drive. ? Use heavy machinery. ? Drink alcohol. ? Take sleeping pills or medicines that cause drowsiness. ? Make important decisions or sign legal documents. ? Take care of children on your own. Eating and drinking  Follow any instructions from your health care provider about eating or drinking restrictions.  When you feel hungry, start by eating small amounts of foods that are soft and easy to digest (bland), such as toast. Gradually return to your regular diet.  Drink enough fluid to keep your urine pale yellow.  If you vomit, rehydrate by drinking water, juice, or clear broth. General instructions  If you have sleep apnea, surgery and certain medicines can increase your risk for breathing problems. Follow instructions from your health care provider about wearing your sleep device: ? Anytime you are sleeping, including during daytime naps. ? While taking prescription pain medicines, sleeping medicines, or medicines that make you drowsy.  Return to  your normal activities as told by your health care provider. Ask your health care provider what activities are safe for you.  Take over-the-counter and prescription medicines only as told by your health care provider.  If you smoke, do not smoke without supervision.  Keep all follow-up visits as told by your health care provider. This is important. Contact a health care provider if:  You have nausea or vomiting that does not get better with medicine.  You cannot eat or drink without vomiting.  You have pain that does not get better with medicine.  You are unable to pass urine.  You develop a skin rash.  You have a fever.  You have redness around your IV site that gets worse. Get help right away if:  You have difficulty breathing.  You have chest pain.  You have blood in your urine or stool, or you vomit blood. Summary  After the procedure, it is common to have a sore throat or nausea. It is also common to feel tired.  Have a responsible adult stay with you for the first 24 hours after general anesthesia. It is important to have someone help care for you until you are awake and alert.  When you feel hungry, start by eating small amounts of foods that are soft and easy to digest (bland), such as toast. Gradually return to your regular diet.  Drink enough fluid to keep your urine pale yellow.  Return to your normal activities as told by your health care provider. Ask your health care provider what activities are safe for you. This information is not   intended to replace advice given to you by your health care provider. Make sure you discuss any questions you have with your health care provider. Document Revised: 10/22/2017 Document Reviewed: 06/04/2017 Elsevier Patient Education  2020 [employer]  Cataract Surgery, Care After This sheet gives you information about how to care for yourself after your procedure. Your health care provider may also give you more specific  instructions. If you have problems or questions, contact your health care provider. What can I expect after the procedure? After the procedure, it is common to have:  Itching.  Discomfort.  Fluid discharge.  Sensitivity to light and to touch.  Bruising in or around the eye.  Mild blurred vision. Follow these instructions at home: Eye care   Do not touch or rub your eyes.  Protect your eyes as told by your health care provider. You may be told to wear a protective eye shield or sunglasses.  Do not put a contact lens into the affected eye or eyes until your health care provider approves.  Keep the area around your eye clean and dry: ? Avoid swimming. ? Do not allow water to hit you directly in the face while showering. ? Keep soap and shampoo out of your eyes.  Check your eye every day for signs of infection. Watch for: ? Redness, swelling, or pain. ? Fluid, blood, or pus. ? Warmth. ? A bad smell. ? Vision that is getting worse. ? Sensitivity that is getting worse. Activity  Do not drive for 24 hours if you were given a sedative during your procedure.  Avoid strenuous activities, such as playing contact sports, for as long as told by your health care provider.  Do not drive or use heavy machinery until your health care provider approves.  Do not bend or lift heavy objects. Bending increases pressure in the eye. You can walk, climb stairs, and do light household chores.  Ask your health care provider when you can return to work. If you work in a dusty environment, you may be advised to wear protective eyewear for a period of time. General instructions  Take or apply over-the-counter and prescription medicines only as told by your health care provider. This includes eye drops.  Keep all follow-up visits as told by your health care provider. This is important. Contact a health care provider if:  You have increased bruising around your eye.  You have pain that is  not helped with medicine.  You have a fever.  You have redness, swelling, or pain in your eye.  You have fluid, blood, or pus coming from your incision.  Your vision gets worse.  Your sensitivity to light gets worse. Get help right away if:  You have sudden loss of vision.  You see flashes of light or spots (floaters).  You have severe eye pain.  You develop nausea or vomiting. Summary  After your procedure, it is common to have itching, discomfort, bruising, fluid discharge, or sensitivity to light.  Follow instructions from your health care provider about caring for your eye after the procedure.  Do not rub your eye after the procedure. You may need to wear eye protection or sunglasses. Do not wear contact lenses. Keep the area around your eye clean and dry.  Avoid activities that require a lot of effort. These include playing sports and lifting heavy objects.  Contact a health care provider if you have increased bruising, pain that does not go away, or a fever. Get help right   away if you suddenly lose your vision, see flashes of light or spots, or have severe pain in the eye. This information is not intended to replace advice given to you by your health care provider. Make sure you discuss any questions you have with your health care provider. Document Revised: 08/15/2019 Document Reviewed: 04/18/2018 Elsevier Patient Education  2020 Else[employer] Inc[employer]  

## 2020-01-02 ENCOUNTER — Ambulatory Visit: Payer: Medicare PPO | Admitting: Anesthesiology

## 2020-01-02 ENCOUNTER — Encounter
Admission: RE | Disposition: A | Discharge status: Home / Self Care | Payer: Self-pay | Source: Home / Self Care | Attending: Ophthalmology

## 2020-01-02 ENCOUNTER — Encounter: Payer: Self-pay | Admitting: Ophthalmology

## 2020-01-02 ENCOUNTER — Ambulatory Visit
Admission: RE | Admit: 2020-01-02 | Discharge: 2020-01-02 | Disposition: A | Discharge status: Home / Self Care | Payer: Medicare PPO | Attending: Ophthalmology | Admitting: Ophthalmology

## 2020-01-02 ENCOUNTER — Other Ambulatory Visit: Payer: Self-pay

## 2020-01-02 DIAGNOSIS — Z96653 Presence of artificial knee joint, bilateral: Secondary | ICD-10-CM | POA: Insufficient documentation

## 2020-01-02 DIAGNOSIS — H2512 Age-related nuclear cataract, left eye: Secondary | ICD-10-CM | POA: Insufficient documentation

## 2020-01-02 DIAGNOSIS — Z6841 Body Mass Index (BMI) 40.0 and over, adult: Secondary | ICD-10-CM | POA: Diagnosis not present

## 2020-01-02 DIAGNOSIS — Z87891 Personal history of nicotine dependence: Secondary | ICD-10-CM | POA: Insufficient documentation

## 2020-01-02 DIAGNOSIS — Z79899 Other long term (current) drug therapy: Secondary | ICD-10-CM | POA: Diagnosis not present

## 2020-01-02 DIAGNOSIS — I10 Essential (primary) hypertension: Secondary | ICD-10-CM | POA: Diagnosis not present

## 2020-01-02 DIAGNOSIS — J45909 Unspecified asthma, uncomplicated: Secondary | ICD-10-CM | POA: Insufficient documentation

## 2020-01-02 DIAGNOSIS — Z9841 Cataract extraction status, right eye: Secondary | ICD-10-CM | POA: Diagnosis not present

## 2020-01-02 DIAGNOSIS — E7800 Pure hypercholesterolemia, unspecified: Secondary | ICD-10-CM | POA: Diagnosis not present

## 2020-01-02 DIAGNOSIS — G4730 Sleep apnea, unspecified: Secondary | ICD-10-CM | POA: Diagnosis not present

## 2020-01-02 DIAGNOSIS — Z8616 Personal history of COVID-19: Secondary | ICD-10-CM | POA: Diagnosis not present

## 2020-01-02 DIAGNOSIS — M1990 Unspecified osteoarthritis, unspecified site: Secondary | ICD-10-CM | POA: Insufficient documentation

## 2020-01-02 HISTORY — PX: CATARACT EXTRACTION W/PHACO: SHX586

## 2020-01-02 SURGERY — PHACOEMULSIFICATION, CATARACT, WITH IOL INSERTION
Anesthesia: Monitor Anesthesia Care | Site: Eye | Laterality: Left

## 2020-01-02 MED ORDER — EPINEPHRINE PF 1 MG/ML IJ SOLN
INTRAOCULAR | Status: DC | PRN
  Administered 2020-01-02: 44 mL via OPHTHALMIC

## 2020-01-02 MED ORDER — TETRACAINE HCL 0.5 % OP SOLN
1.0000 [drp] | OPHTHALMIC | Status: DC | PRN
  Administered 2020-01-02 (×3): 1 [drp] via OPHTHALMIC

## 2020-01-02 MED ORDER — LIDOCAINE HCL (PF) 2 % IJ SOLN
INTRAOCULAR | Status: DC | PRN
  Administered 2020-01-02: 1 mL

## 2020-01-02 MED ORDER — LACTATED RINGERS IV SOLN: INTRAVENOUS | Status: DC

## 2020-01-02 MED ORDER — ARMC OPHTHALMIC DILATING DROPS
1.0000 "application " | OPHTHALMIC | Status: DC | PRN
  Administered 2020-01-02 (×3): 1 via OPHTHALMIC

## 2020-01-02 MED ORDER — NA CHONDROIT SULF-NA HYALURON 40-17 MG/ML IO SOLN
INTRAOCULAR | Status: DC | PRN
  Administered 2020-01-02: 1 mL via INTRAOCULAR

## 2020-01-02 MED ORDER — MOXIFLOXACIN HCL 0.5 % OP SOLN
OPHTHALMIC | Status: DC | PRN
  Administered 2020-01-02: 0.2 mL via OPHTHALMIC

## 2020-01-02 MED ORDER — FENTANYL CITRATE (PF) 100 MCG/2ML IJ SOLN
INTRAMUSCULAR | Status: DC | PRN
  Administered 2020-01-02: 100 ug via INTRAVENOUS

## 2020-01-02 MED ORDER — MIDAZOLAM HCL 2 MG/2ML IJ SOLN
INTRAMUSCULAR | Status: DC | PRN
  Administered 2020-01-02: 2 mg via INTRAVENOUS
  Administered 2020-01-02: 1 mg via INTRAVENOUS

## 2020-01-02 MED ORDER — BRIMONIDINE TARTRATE-TIMOLOL 0.2-0.5 % OP SOLN
OPHTHALMIC | Status: DC | PRN
  Administered 2020-01-02: 1 [drp] via OPHTHALMIC

## 2020-01-02 SURGICAL SUPPLY — 21 items
CANNULA ANT/CHMB 27G (MISCELLANEOUS) ×2 IMPLANT
CANNULA ANT/CHMB 27GA (MISCELLANEOUS) ×6 IMPLANT
GLOVE SURG LX 8.0 MICRO (GLOVE) ×2
GLOVE SURG LX STRL 8.0 MICRO (GLOVE) ×1 IMPLANT
GLOVE SURG TRIUMPH 8.0 PF LTX (GLOVE) ×3 IMPLANT
GOWN STRL REUS W/ TWL LRG LVL3 (GOWN DISPOSABLE) ×2 IMPLANT
GOWN STRL REUS W/TWL LRG LVL3 (GOWN DISPOSABLE) ×6
LENS IOL DIOP 22.0 (Intraocular Lens) ×3 IMPLANT
LENS IOL TECNIS MONO 22.0 (Intraocular Lens) IMPLANT
MARKER SKIN DUAL TIP RULER LAB (MISCELLANEOUS) ×3 IMPLANT
NDL FILTER BLUNT 18X1 1/2 (NEEDLE) ×1 IMPLANT
NDL RETROBULBAR .5 NSTRL (NEEDLE) ×3 IMPLANT
NEEDLE FILTER BLUNT 18X 1/2SAF (NEEDLE) ×2
NEEDLE FILTER BLUNT 18X1 1/2 (NEEDLE) ×1 IMPLANT
PACK EYE AFTER SURG (MISCELLANEOUS) ×3 IMPLANT
PACK OPTHALMIC (MISCELLANEOUS) ×3 IMPLANT
PACK PORFILIO (MISCELLANEOUS) ×3 IMPLANT
SYR 3ML LL SCALE MARK (SYRINGE) ×3 IMPLANT
SYR TB 1ML LUER SLIP (SYRINGE) ×3 IMPLANT
WATER STERILE IRR 250ML POUR (IV SOLUTION) ×3 IMPLANT
WIPE NON LINTING 3.25X3.25 (MISCELLANEOUS) ×3 IMPLANT

## 2020-01-02 NOTE — Transfer of Care (Signed)
Immediate Anesthesia Transfer of Care Note  Patient: Erin Bond  Procedure(s) Performed: CATARACT EXTRACTION PHACO AND INTRAOCULAR LENS PLACEMENT (IOC) LEFT 5.69  00:35.0 (Left Eye)  Patient Location: PACU  Anesthesia Type: MAC  Level of Consciousness: awake, alert  and patient cooperative  Airway and Oxygen Therapy: Patient Spontanous Breathing and Patient connected to supplemental oxygen  Post-op Assessment: Post-op Vital signs reviewed, Patient's Cardiovascular Status Stable, Respiratory Function Stable, Patent Airway and No signs of Nausea or vomiting  Post-op Vital Signs: Reviewed and stable  Complications: No apparent anesthesia complications

## 2020-01-02 NOTE — H&P (Signed)
All labs reviewed. Abnormal studies sent to patients PCP when indicated.  Previous H&P reviewed, patient examined, there are NO CHANGES.  Erin Hiraldo Porfilio3/2/20217:51 AM

## 2020-01-02 NOTE — Anesthesia Preprocedure Evaluation (Signed)
Anesthesia Evaluation  Patient identified by MRN, date of birth, ID band Patient awake    Reviewed: Allergy & Precautions, H&P , NPO status , Patient's Chart, lab work & pertinent test results  Airway Mallampati: II  TM Distance: >3 FB Neck ROM: full    Dental no notable dental hx.    Pulmonary asthma , sleep apnea , former smoker,    breath sounds clear to auscultation       Cardiovascular hypertension,  Rhythm:regular Rate:Normal     Neuro/Psych  Headaches,    GI/Hepatic hiatal hernia, GERD  ,  Endo/Other  Morbid obesity  Renal/GU      Musculoskeletal  (+) Arthritis ,   Abdominal   Peds  Hematology   Anesthesia Other Findings   Reproductive/Obstetrics                             Anesthesia Physical  Anesthesia Plan  ASA: III  Anesthesia Plan: MAC   Post-op Pain Management:    Induction:   PONV Risk Score and Plan: 2 and Midazolam, TIVA and Treatment may vary due to age or medical condition  Airway Management Planned:   Additional Equipment:   Intra-op Plan:   Post-operative Plan:   Informed Consent: I have reviewed the patients History and Physical, chart, labs and discussed the procedure including the risks, benefits and alternatives for the proposed anesthesia with the patient or authorized representative who has indicated his/her understanding and acceptance.       Plan Discussed with: CRNA  Anesthesia Plan Comments:         Anesthesia Quick Evaluation

## 2020-01-02 NOTE — Anesthesia Postprocedure Evaluation (Signed)
Anesthesia Post Note  Patient: Erin Bond  Procedure(s) Performed: CATARACT EXTRACTION PHACO AND INTRAOCULAR LENS PLACEMENT (IOC) LEFT 5.69  00:35.0 (Left Eye)     Patient location during evaluation: PACU Anesthesia Type: MAC Level of consciousness: awake Pain management: pain level controlled Vital Signs Assessment: post-procedure vital signs reviewed and stable Respiratory status: respiratory function stable Cardiovascular status: stable Postop Assessment: no apparent nausea or vomiting Anesthetic complications: no    Veda Canning

## 2020-01-02 NOTE — Op Note (Signed)
PREOPERATIVE DIAGNOSIS:  Nuclear sclerotic cataract of the left eye.   POSTOPERATIVE DIAGNOSIS:  Nuclear sclerotic cataract of the left eye.   OPERATIVE PROCEDURE:@   SURGEON:  Galen Manila, MD.   ANESTHESIA:  Anesthesiologist: Jola Babinski, MD CRNA: Michaele Offer, CRNA  1.      Managed anesthesia care. 2.     0.66ml of Shugarcaine was instilled following the paracentesis   COMPLICATIONS:  None.   TECHNIQUE:   Stop and chop   DESCRIPTION OF PROCEDURE:  The patient was examined and consented in the preoperative holding area where the aforementioned topical anesthesia was applied to the left eye and then brought back to the Operating Room where the left eye was prepped and draped in the usual sterile ophthalmic fashion and a lid speculum was placed. A paracentesis was created with the side port blade and the anterior chamber was filled with viscoelastic. A near clear corneal incision was performed with the steel keratome. A continuous curvilinear capsulorrhexis was performed with a cystotome followed by the capsulorrhexis forceps. Hydrodissection and hydrodelineation were carried out with BSS on a blunt cannula. The lens was removed in a stop and chop  technique and the remaining cortical material was removed with the irrigation-aspiration handpiece. The capsular bag was inflated with viscoelastic and the Technis [id number] lens was placed in the capsular bag without complication. The remaining viscoelastic was removed from the eye with the irrigation-aspiration handpiece. The wounds were hydrated. The anterior chamber was flushed with BSS and the eye was inflated to physiologic pressure. 0.40ml Vigamox was placed in the anterior chamber. The wounds were found to be water tight. The eye was dressed with Combigan. The patient was given protective glasses to wear throughout the day and a shield with which to sleep tonight. The patient was also given drops with which to begin a drop regimen today and  will follow-up with me in one day. Implant Name Type Inv. Item Serial No. Manufacturer Lot No. LRB No. Used Action  LENS IOL DIOP 22.0 - [id number] Intraocular Lens LENS IOL DIOP 22.0 [id number]  Left 1 Implanted    Procedure(s) with comments: CATARACT EXTRACTION PHACO AND INTRAOCULAR LENS PLACEMENT (IOC) LEFT 5.69  00:35.0 (Left) - sleep apnea COVID (+) 10/13/19  Electronically signed: Galen Manila 01/02/2020 8:19 AM

## 2020-10-04 ENCOUNTER — Other Ambulatory Visit: Payer: Self-pay | Admitting: Family Medicine

## 2020-10-04 DIAGNOSIS — Z1231 Encounter for screening mammogram for malignant neoplasm of breast: Secondary | ICD-10-CM

## 2020-11-05 ENCOUNTER — Other Ambulatory Visit: Payer: Self-pay

## 2020-11-05 ENCOUNTER — Ambulatory Visit
Admission: RE | Admit: 2020-11-05 | Discharge: 2020-11-05 | Disposition: A | Discharge status: Home / Self Care | Payer: Medicare PPO | Source: Ambulatory Visit | Attending: Family Medicine | Admitting: Family Medicine

## 2020-11-05 DIAGNOSIS — Z1231 Encounter for screening mammogram for malignant neoplasm of breast: Secondary | ICD-10-CM | POA: Diagnosis present

## 2020-11-12 ENCOUNTER — Other Ambulatory Visit: Payer: Self-pay | Admitting: Family Medicine

## 2020-11-12 DIAGNOSIS — R928 Other abnormal and inconclusive findings on diagnostic imaging of breast: Secondary | ICD-10-CM

## 2020-11-12 DIAGNOSIS — N6489 Other specified disorders of breast: Secondary | ICD-10-CM

## 2020-11-19 ENCOUNTER — Ambulatory Visit
Admission: RE | Admit: 2020-11-19 | Discharge: 2020-11-19 | Disposition: A | Discharge status: Home / Self Care | Payer: Medicare PPO | Source: Ambulatory Visit | Attending: Family Medicine | Admitting: Family Medicine

## 2020-11-19 ENCOUNTER — Other Ambulatory Visit: Payer: Self-pay

## 2020-11-19 DIAGNOSIS — N6489 Other specified disorders of breast: Secondary | ICD-10-CM

## 2020-11-19 DIAGNOSIS — R928 Other abnormal and inconclusive findings on diagnostic imaging of breast: Secondary | ICD-10-CM

## 2022-01-01 ENCOUNTER — Other Ambulatory Visit: Payer: Self-pay | Admitting: Family Medicine

## 2022-01-01 DIAGNOSIS — Z1231 Encounter for screening mammogram for malignant neoplasm of breast: Secondary | ICD-10-CM

## 2022-02-06 ENCOUNTER — Ambulatory Visit
Admission: RE | Admit: 2022-02-06 | Discharge: 2022-02-06 | Disposition: A | Discharge status: Home / Self Care | Payer: Medicare PPO | Source: Ambulatory Visit | Attending: Family Medicine | Admitting: Family Medicine

## 2022-02-06 DIAGNOSIS — Z1231 Encounter for screening mammogram for malignant neoplasm of breast: Secondary | ICD-10-CM | POA: Diagnosis present

## 2022-10-22 IMAGING — MG MM DIGITAL SCREENING BILAT W/ TOMO AND CAD
8 series · 8 of 24 positions shown · non-contrast
Comparison: Previous exam(s).

CLINICAL DATA: Screening.

EXAM:
DIGITAL SCREENING BILATERAL MAMMOGRAM WITH TOMOSYNTHESIS AND CAD
TECHNIQUE: Bilateral screening digital craniocaudal and mediolateral oblique
mammograms were obtained. Bilateral screening digital breast
tomosynthesis was performed. The images were evaluated with
computer-aided detection.

[L CC synth-2D]
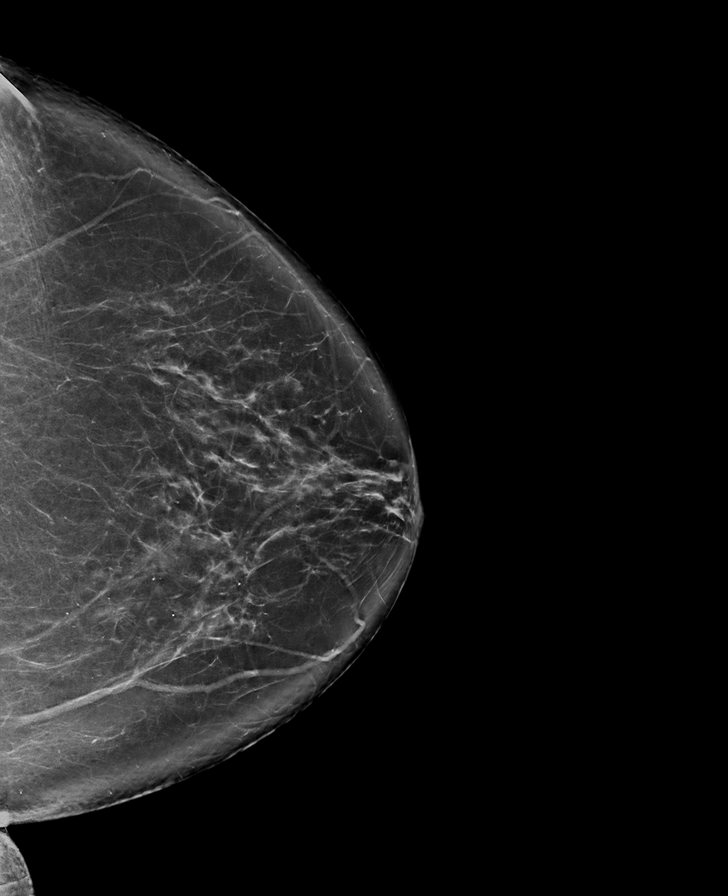

[R CC synth-2D]
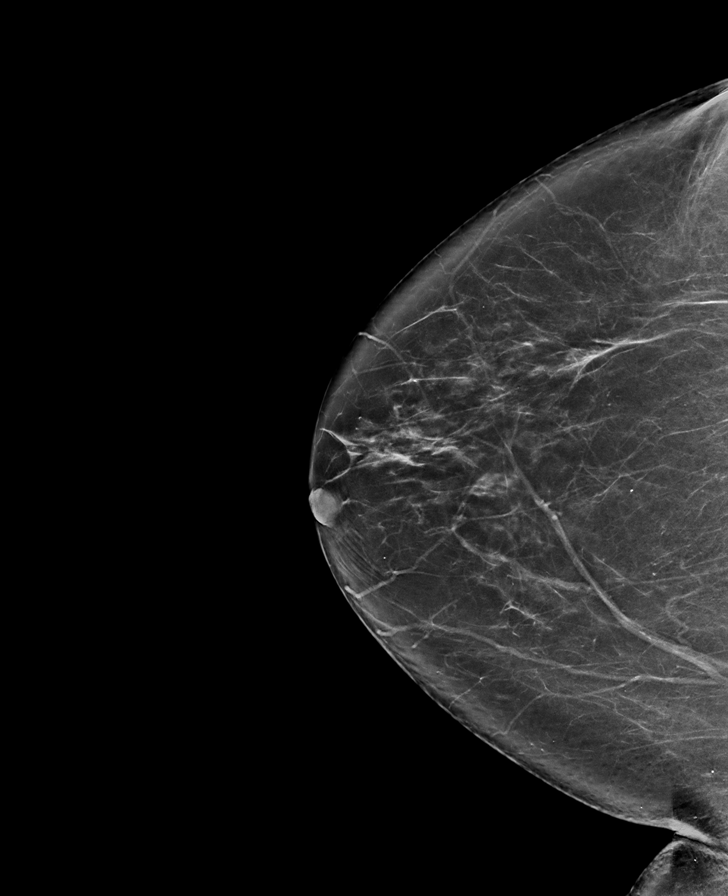

[L MLO synth-2D]
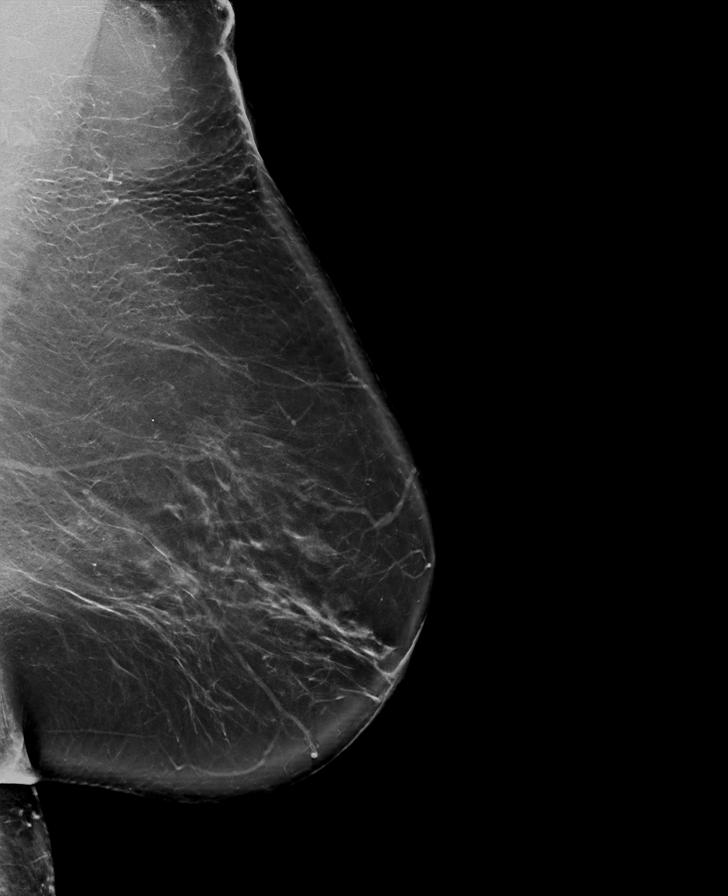

[R MLO synth-2D]
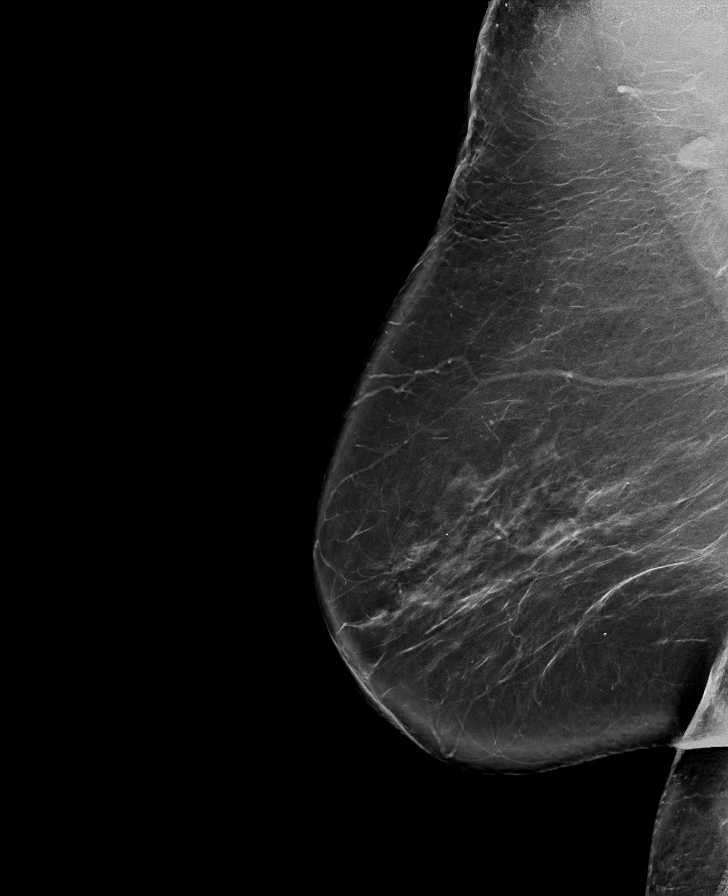

[L CC tomo · tomo slice 45/88.0]
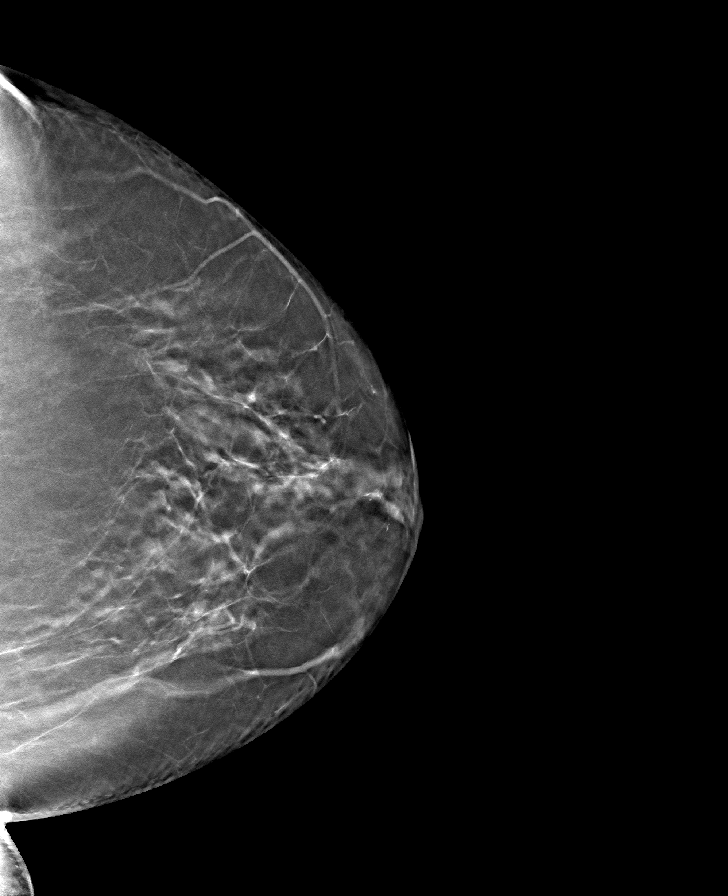

[R MLO tomo · tomo slice 49/97.0]
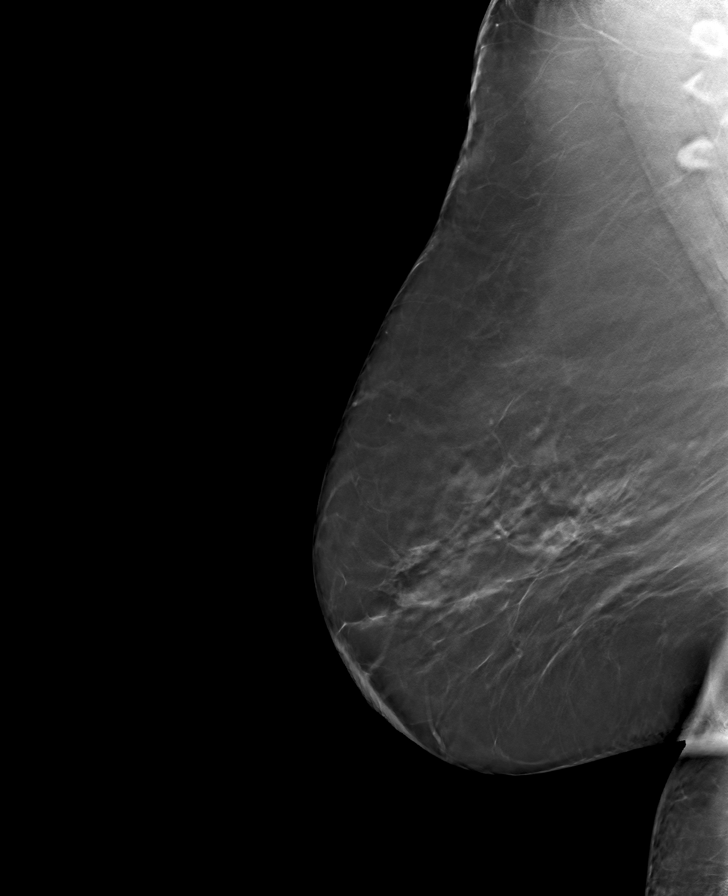

[L MLO tomo · tomo slice 51/102.0]
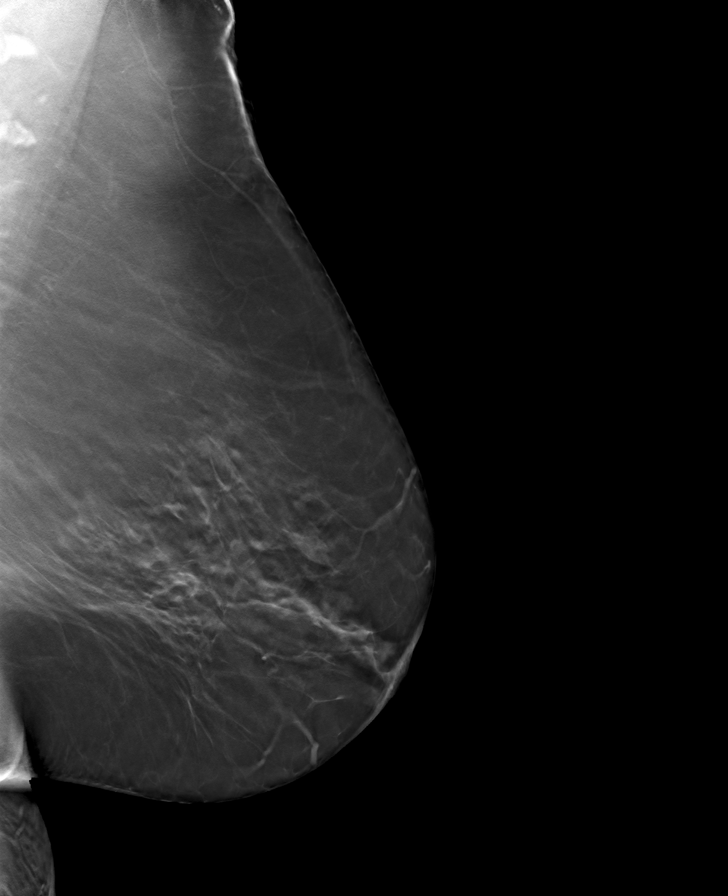

[R CC tomo · tomo slice 41/82.0]
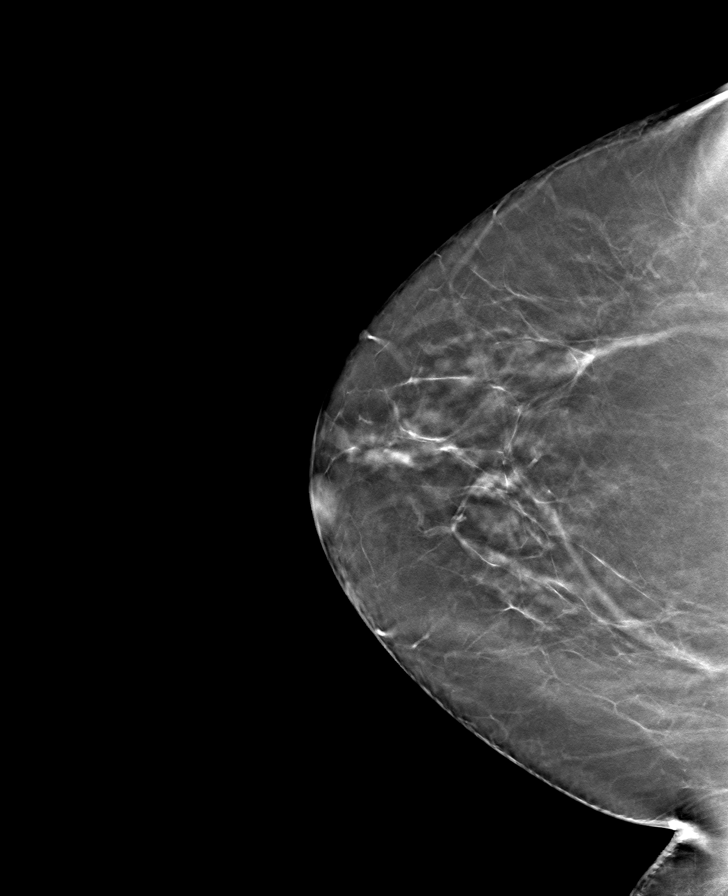

[8 of 24 positions shown; findings below may reference images not displayed]

ACR Breast Density Category b: There are scattered areas of
fibroglandular density.
FINDINGS: There are no findings suspicious for malignancy.
IMPRESSION: No mammographic evidence of malignancy. A result letter of this
screening mammogram will be mailed directly to the patient.

RECOMMENDATION:
Screening mammogram in one year. (Code:51-O-LD2)

BI-RADS CATEGORY  1: Negative.

## 2023-01-14 ENCOUNTER — Other Ambulatory Visit: Payer: Self-pay | Admitting: Family Medicine

## 2023-01-14 DIAGNOSIS — Z1231 Encounter for screening mammogram for malignant neoplasm of breast: Secondary | ICD-10-CM

## 2023-02-08 ENCOUNTER — Ambulatory Visit
Admission: RE | Admit: 2023-02-08 | Discharge: 2023-02-08 | Disposition: A | Discharge status: Home / Self Care | Payer: Medicare PPO | Source: Ambulatory Visit | Attending: Family Medicine | Admitting: Family Medicine

## 2023-02-08 DIAGNOSIS — Z1231 Encounter for screening mammogram for malignant neoplasm of breast: Secondary | ICD-10-CM | POA: Diagnosis present

## 2024-01-04 ENCOUNTER — Other Ambulatory Visit: Payer: Self-pay | Admitting: Family Medicine

## 2024-01-04 DIAGNOSIS — Z1231 Encounter for screening mammogram for malignant neoplasm of breast: Secondary | ICD-10-CM

## 2024-02-09 ENCOUNTER — Ambulatory Visit
Admission: RE | Admit: 2024-02-09 | Discharge: 2024-02-09 | Disposition: A | Discharge status: Home / Self Care | Source: Ambulatory Visit | Attending: Family Medicine | Admitting: Family Medicine

## 2024-02-09 DIAGNOSIS — Z1231 Encounter for screening mammogram for malignant neoplasm of breast: Secondary | ICD-10-CM | POA: Insufficient documentation
